# Patient Record
Sex: Female | Born: 2005 | Race: White | Hispanic: No | Marital: Single | Smoking: Never smoker
Health system: Southern US, Community
[De-identification: ages and names within clinical notes are randomized; demographics above are authoritative.]

## PROBLEM LIST (undated history)

## (undated) DIAGNOSIS — F419 Anxiety disorder, unspecified: Secondary | ICD-10-CM

## (undated) HISTORY — PX: NO PAST SURGERIES: SHX2092

---
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
## 2024-05-22 ENCOUNTER — Ambulatory Visit
Admission: EM | Admit: 2024-05-22 | Discharge: 2024-05-22 | Disposition: A | Discharge status: Home / Self Care | Payer: Self-pay | Attending: Family Medicine | Admitting: Family Medicine

## 2024-05-22 DIAGNOSIS — R102 Pelvic and perineal pain unspecified side: Secondary | ICD-10-CM | POA: Insufficient documentation

## 2024-05-22 DIAGNOSIS — Z202 Contact with and (suspected) exposure to infections with a predominantly sexual mode of transmission: Secondary | ICD-10-CM | POA: Insufficient documentation

## 2024-05-22 HISTORY — DX: Anxiety disorder, unspecified: F41.9

## 2024-05-22 MED ORDER — DOXYCYCLINE HYCLATE 100 MG PO CAPS: 100.0000 mg | ORAL_CAPSULE | Freq: Two times a day (BID) | ORAL | 0 refills | Status: DC

## 2024-05-22 MED ORDER — CEFTRIAXONE SODIUM 1 G IJ SOLR: 0.5000 g | Freq: Once | INTRAMUSCULAR | Status: DC

## 2024-05-22 MED ORDER — FLUCONAZOLE 150 MG PO TABS: 150.0000 mg | ORAL_TABLET | Freq: Once | ORAL | 0 refills | Status: AC

## 2024-05-22 NOTE — ED Provider Notes (Signed)
 " [facility]    CSN: [id number] Arrival date & time: 05/22/24  1920      History   Chief Complaint Chief Complaint  Patient presents with   Vaginal Pain     HPI HPI Danielle Holland is a 18 y.o. female.    Danielle Holland presents for intermittent right pelvic pain for the past 2 weeks.  Believes that she has had similar pain since she had her period in the 5th grade.  Pain has been very bad in the past 2 weeks. Today, she received a call from a partner saying that they had chlamydia. No vaginal discharge or odor. She is  not currently pregnant.      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      Home Medications    Prior to Admission medications  Medication Sig Start Date End Date Taking? Authorizing Provider  doxycycline (VIBRAMYCIN) 100 MG capsule Take 1 capsule (100 mg total) by mouth 2 (two) times daily. 05/22/24  Yes Alroy Portela, DO  fluconazole  (DIFLUCAN ) 150 MG tablet Take 1 tablet (150 mg total) by mouth once for 1 dose. 05/29/24 05/29/24 Yes Maritsa Hunsucker, DO  LORazepam (ATIVAN) 1 MG tablet Take 1 mg by mouth 3 (three) times daily. 05/17/24  Yes [provider]    Family History History reviewed. No pertinent family history.  Social History Social  History[1]   Allergies   Patient has no known allergies.   Review of Systems Review of Systems: :negative unless otherwise stated in HPI.      Physical Exam Triage Vital Signs ED Triage Vitals  Encounter Vitals Group     BP 05/22/24 1948 128/84     Girls Systolic BP Percentile --      Girls Diastolic BP Percentile --      Boys Systolic BP Percentile --      Boys Diastolic BP Percentile --      Pulse Rate 05/22/24 1948 100     Resp 05/22/24 1948 18     Temp 05/22/24 1948 98.4 F (36.9 C)     Temp Source 05/22/24 1948 Oral     SpO2 05/22/24 1948 97 %     Weight 05/22/24 1944 173 lb 9.6 oz (78.7 kg)     Height --      Head Circumference --      Peak Flow --      Pain Score 05/22/24 1946 8     Pain Loc --      Pain Education --      Exclude from Growth Chart --    No data found.  Updated Vital Signs BP 128/84 (BP Location: Right Arm)   Pulse 100   Temp 98.4  F (36.9 C) (Oral)   Resp 18   Wt 78.7 kg   LMP  (LMP Unknown)   SpO2 97%   Visual Acuity Right Eye Distance:   Left Eye Distance:   Bilateral Distance:    Right Eye Near:   Left Eye Near:    Bilateral Near:     Physical Exam GEN: well appearing female in no acute distress  CVS: well perfused  RESP: speaking in full sentences without pause  ABD: soft, mild suprapubic tenderness to palpation, non-distended, no palpable masses   GU: deferred, uterus below the umbilicus, patient performed self swab     UC Treatments / Results  Labs (all labs ordered are listed, but only abnormal results are displayed) Labs Reviewed  SYPHILIS: RPR W/REFLEX TO RPR TITER AND TREPONEMAL ANTIBODIES, TRADITIONAL SCREENING AND DIAGNOSIS ALGORITHM  HIV ANTIBODY (ROUTINE TESTING W REFLEX)  CERVICOVAGINAL ANCILLARY ONLY    EKG   Radiology No results found.  Procedures Procedures (including critical care time)  Medications Ordered in UC Medications - No data to display  Initial Impression / Assessment and Plan /  UC Course  I have reviewed the triage vital signs and the nursing notes.  Pertinent labs & imaging results that were available during my care of the patient were reviewed by me and considered in my medical decision making (see chart for details).      Patient is a 18 y.o.Danielle Holland female  who presents for pelvic pain that is worse on the right for the past 2 weeks.  She has known exposure to chlamydia.  Overall patient is well-appearing and afebrile.  Vital signs stable. Vaginal swab for  trichomonas, gonorrhea and chlamydia obtained.  Patient does not request prophylactic STI treatment for gonorrhea but will be treated for chlamydia today.  Patient is agreeable to HIV and syphilis testing.  She has no lesions to test for HSV and blood work for this is not recommended at this time. - Treatment: Doxycycline 100 mg  BID x 7 days and advised patient to wear condoms and have follow-up - Diflucan  for 1 dose for prevention of yeast infection   Return precautions including abdominal pain, fever, chills, nausea, or vomiting given. Discussed MDM, treatment plan and plan for follow-up with patient who agrees with plan.       Final Clinical Impressions(s) / UC Diagnoses   Final diagnoses:  Exposure to chlamydia  Pelvic pain     Discharge Instructions      You have been treated for chlamydia here.  Your STD test results will be available in the next 72 hours. If positive, someone will contact you.  You should see your results in your MyChart account.         ED Prescriptions     Medication Sig Dispense Auth. Provider   doxycycline (VIBRAMYCIN) 100 MG capsule Take 1 capsule (100 mg total) by mouth 2 (two) times daily. 14 capsule Matix Henshaw, DO   fluconazole  (DIFLUCAN ) 150 MG tablet Take 1 tablet (150 mg total) by mouth once for 1 dose. 1 tablet Kriste Berth, DO      PDMP not reviewed this encounter.      [1]  Social History Tobacco Use   Smoking status: Never   Smokeless  tobacco: Never  Vaping Use   Vaping status: Never Used  Substance Use Topics   Alcohol use: Yes   Drug use: Never     Dajha Urquilla, DO 05/22/24 2024  "

## 2024-05-22 NOTE — ED Triage Notes (Signed)
 Pt c/o R ovary pain that has been ongoing off/on x2 wks.. Denies any vaginal bleeding. Has been seen at multiple ED's for the same issue & received no dx.

## 2024-05-22 NOTE — Discharge Instructions (Addendum)
 You have been treated for chlamydia here.  Your STD test results will be available in the next 72 hours. If positive, someone will contact you.  You should see your results in your MyChart account.

## 2024-05-23 LAB — CERVICOVAGINAL ANCILLARY ONLY
Bacterial Vaginitis (gardnerella): NEGATIVE
Chlamydia: POSITIVE — AB
Comment: NEGATIVE
Comment: NEGATIVE
Comment: NEGATIVE
Comment: NORMAL
Neisseria Gonorrhea: NEGATIVE
Trichomonas: NEGATIVE

## 2024-05-23 LAB — HIV ANTIBODY (ROUTINE TESTING W REFLEX): HIV Screen 4th Generation wRfx: NONREACTIVE

## 2024-05-24 ENCOUNTER — Ambulatory Visit: Payer: Self-pay | Admitting: Family Medicine

## 2024-05-24 LAB — SYPHILIS: RPR W/REFLEX TO RPR TITER AND TREPONEMAL ANTIBODIES, TRADITIONAL SCREENING AND DIAGNOSIS ALGORITHM: RPR Ser Ql: NONREACTIVE

## 2024-06-17 ENCOUNTER — Encounter: Payer: Self-pay | Admitting: Emergency Medicine

## 2024-06-17 ENCOUNTER — Ambulatory Visit
Admission: EM | Admit: 2024-06-17 | Discharge: 2024-06-17 | Disposition: A | Discharge status: Home / Self Care | Payer: Self-pay | Attending: Physician Assistant | Admitting: Physician Assistant

## 2024-06-17 DIAGNOSIS — Z3202 Encounter for pregnancy test, result negative: Secondary | ICD-10-CM | POA: Insufficient documentation

## 2024-06-17 DIAGNOSIS — B379 Candidiasis, unspecified: Secondary | ICD-10-CM | POA: Insufficient documentation

## 2024-06-17 DIAGNOSIS — Z202 Contact with and (suspected) exposure to infections with a predominantly sexual mode of transmission: Secondary | ICD-10-CM | POA: Insufficient documentation

## 2024-06-17 DIAGNOSIS — T3695XA Adverse effect of unspecified systemic antibiotic, initial encounter: Secondary | ICD-10-CM | POA: Insufficient documentation

## 2024-06-17 DIAGNOSIS — Z8619 Personal history of other infectious and parasitic diseases: Secondary | ICD-10-CM | POA: Insufficient documentation

## 2024-06-17 LAB — POCT URINE PREGNANCY: Preg Test, Ur: NEGATIVE

## 2024-06-17 MED ORDER — FLUCONAZOLE 150 MG PO TABS: ORAL_TABLET | ORAL | 0 refills | Status: AC

## 2024-06-17 MED ORDER — DOXYCYCLINE HYCLATE 100 MG PO CAPS: 100.0000 mg | ORAL_CAPSULE | Freq: Two times a day (BID) | ORAL | 0 refills | Status: AC

## 2024-06-17 NOTE — ED Triage Notes (Signed)
 Patient states that she was treated for Chlamydia a month ago and had sex with the same partner who did not take his treatment.  Patient wants to be retested and treated for Chlamydia. Patient reports some pelvic pain and vaginal itching and vaginal disharge that started 4 days ago.

## 2024-06-17 NOTE — Discharge Instructions (Addendum)
-   Negative pregnancy test. - Pending STI test results. - I went ahead and sent the medication to the pharmacy to treat chlamydia.  I also sent the yeast infection medication. - Your partner absolutely has to take the medication or you will develop chlamydia again and this can make you to become very ill and cause an infection throughout your pelvis and even lead to fertility issues in the future.  Make sure he is fully aware of this. - No sexual intercourse until 1 week after you both have completed treatment.  So, if you start medication today no sexual intercourse until 2 weeks from today.

## 2024-06-17 NOTE — ED Provider Notes (Signed)
 " [facility]    CSN: [id number] Arrival date & time: 06/17/24  1147      History   Chief Complaint Chief Complaint  Patient presents with   Exposure to STD    HPI Danielle Holland is a 19 y.o. female presenting for 4 day history of pelvic pain, vaginal discharge, and vaginal itching.  Patient says her partner went to the ER before Christmas and was diagnosed with chlamydia.  He was prescribed antibiotics.  Patient tested positive for chlamydia and was treated with doxycycline  on 05/24/24.  She took the treatment but her partner did not.  She says she discovered the unopened bottle of antibiotics and his dresser drawer.  They re-engaged in sexual activity and her symptoms returned.  She thinks her partner could possibly have other partners.  She has not had any other partners in the past year and a half.  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      Home Medications    Prior to Admission medications  Medication Sig Start Date End Date Taking? Authorizing Provider  doxycycline  (VIBRAMYCIN ) 100 MG capsule Take 1 capsule (100 mg total) by mouth 2 (two) times daily for 7 days. 06/17/24 06/24/24 Yes Danielle Jolan NOVAK, PA-C  fluconazole  (DIFLUCAN ) 150 MG tablet Take 1 tab p.o. every 72 hours for yeast infection 06/17/24  Yes Danielle Jolan B, PA-C  LORazepam (ATIVAN) 1 MG tablet Take 1 mg by mouth 3 (three) times daily. 05/17/24   [provider]    Family History History reviewed. No pertinent family history.  Social History Social History[1]   Allergies   Patient has no known allergies.   Review of  Systems Review of Systems  Constitutional:  Negative for fatigue and fever.  Cardiovascular:  Negative for chest pain.  Gastrointestinal:  Positive for abdominal pain. Negative for nausea and vomiting.  Genitourinary:  Positive for pelvic pain and vaginal discharge. Negative for difficulty urinating, dysuria, flank pain, frequency, hematuria, urgency, vaginal bleeding and vaginal pain.  Musculoskeletal:  Negative for back pain.  Skin:  Negative for rash.     Physical Exam Triage Vital Signs ED Triage Vitals  Encounter Vitals Group     BP      Girls Systolic BP Percentile      Girls Diastolic BP Percentile      Boys Systolic BP Percentile      Boys Diastolic BP Percentile      Pulse      Resp      Temp      Temp src      SpO2      Weight      Height      Head Circumference      Peak Flow      Pain Score      Pain Loc      Pain Education      Exclude from Growth Chart    No data found.  Updated Vital Signs BP 125/82 (BP Location: Right Arm)   Pulse 71   Temp 98.8 F (37.1 C) (Oral)  Resp 14   Ht 5' 8 (1.727 m)   Wt 176 lb (79.8 kg)   LMP  (LMP Unknown)   SpO2 99%   BMI 26.76 kg/m   Physical Exam Vitals and nursing note reviewed.  Constitutional:      General: She is not in acute distress.    Appearance: Normal appearance. She is not ill-appearing or toxic-appearing.  HENT:     Head: Normocephalic and atraumatic.  Eyes:     General: No scleral icterus.       Right eye: No discharge.        Left eye: No discharge.     Conjunctiva/sclera: Conjunctivae normal.  Cardiovascular:     Rate and Rhythm: Normal rate and regular rhythm.     Heart sounds: Normal heart sounds.  Pulmonary:     Effort: Pulmonary effort is normal. No respiratory distress.     Breath sounds: Normal breath sounds.  Abdominal:     Palpations: Abdomen is soft.     Tenderness: There is abdominal tenderness (suprapubic).  Musculoskeletal:     Cervical back: Neck supple.  Skin:     General: Skin is dry.  Neurological:     General: No focal deficit present.     Mental Status: She is alert. Mental status is at baseline.     Motor: No weakness.     Gait: Gait normal.  Psychiatric:        Mood and Affect: Mood normal.        Behavior: Behavior normal.      UC Treatments / Results  Labs (all labs ordered are listed, but only abnormal results are displayed) Labs Reviewed  POCT URINE PREGNANCY - Normal  CERVICOVAGINAL ANCILLARY ONLY    EKG   Radiology No results found.  Procedures Procedures (including critical care time)  Medications Ordered in UC Medications - No data to display  Initial Impression / Assessment and Plan / UC Course  I have reviewed the triage vital signs and the nursing notes.  Pertinent labs & imaging results that were available during my care of the patient were reviewed by me and considered in my medical decision making (see chart for details).   19 year old female presents for vaginal discharge and itching with pelvic pain for the past few days.  She was treated for chlamydia last month and took the full treatment (doxycycline ) but her partner did not.  He was under the impression her partner took the antibiotics but she recently found the unopened bottle in his dresser drawer.  They have engaged in sexual activity and her symptoms returned.  Patient swabbed herself for gonorrhea/chlamydia/trichomonas/BV and yeast.  LMP was about a month ago.  Urine pregnancy was negative.  Will treat at this time for suspected chlamydia again.  Antibiotic sent to pharmacy.  Patient advised the importance of no sexual intercourse until a week after standpoint have completed treatment.  Advised for her to make sure her partner knows he absolutely has to be treated for this condition or he will spread it to others.  Will amend treatment based on swab results if needed.  I did send Diflucan  as needed for antibiotic induced yeast infection at patient  request.   Final Clinical Impressions(s) / UC Diagnoses   Final diagnoses:  STD exposure  History of chlamydia  Negative pregnancy test  Antibiotic-induced yeast infection     Discharge Instructions      - Negative pregnancy test. - Pending STI test results. - I  went ahead and sent the medication to the pharmacy to treat chlamydia.  I also sent the yeast infection medication. - Your partner absolutely has to take the medication or you will develop chlamydia again and this can make you to become very ill and cause an infection throughout your pelvis and even lead to fertility issues in the future.  Make sure he is fully aware of this. - No sexual intercourse until 1 week after you both have completed treatment.  So, if you start medication today no sexual intercourse until 2 weeks from today.     ED Prescriptions     Medication Sig Dispense Auth. Provider   doxycycline  (VIBRAMYCIN ) 100 MG capsule Take 1 capsule (100 mg total) by mouth 2 (two) times daily for 7 days. 14 capsule Danielle Huxley B, PA-C   fluconazole  (DIFLUCAN ) 150 MG tablet Take 1 tab p.o. every 72 hours for yeast infection 2 tablet Danielle Huxley NOVAK, PA-C      PDMP not reviewed this encounter.     [1]  Social History Tobacco Use   Smoking status: Never   Smokeless tobacco: Never  Vaping Use   Vaping status: Never Used  Substance Use Topics   Alcohol use: Yes   Drug use: Never     Danielle Holland 06/17/24 1317  "

## 2024-06-20 ENCOUNTER — Ambulatory Visit (HOSPITAL_COMMUNITY): Payer: Self-pay

## 2024-06-20 LAB — CERVICOVAGINAL ANCILLARY ONLY
Bacterial Vaginitis (gardnerella): NEGATIVE
Candida Glabrata: NEGATIVE
Candida Vaginitis: POSITIVE — AB
Chlamydia: NEGATIVE
Comment: NEGATIVE
Comment: NEGATIVE
Comment: NEGATIVE
Comment: NEGATIVE
Comment: NEGATIVE
Comment: NORMAL
Neisseria Gonorrhea: NEGATIVE
Trichomonas: NEGATIVE
